# Patient Record
Sex: Male | Born: 1985 | Hispanic: Yes | Marital: Married | Smoking: Never smoker
Health system: Southern US, Community
[De-identification: ages and names within clinical notes are randomized; demographics above are authoritative.]

---

## 2014-11-30 ENCOUNTER — Emergency Department
Admission: EM | Admit: 2014-11-30 | Discharge: 2014-11-30 | Disposition: A | Discharge status: Home / Self Care | Payer: Self-pay | Attending: Emergency Medicine | Admitting: Emergency Medicine

## 2014-11-30 ENCOUNTER — Emergency Department: Payer: Self-pay

## 2014-11-30 ENCOUNTER — Encounter: Payer: Self-pay | Admitting: *Deleted

## 2014-11-30 DIAGNOSIS — S5001XA Contusion of right elbow, initial encounter: Secondary | ICD-10-CM | POA: Insufficient documentation

## 2014-11-30 DIAGNOSIS — S29001A Unspecified injury of muscle and tendon of front wall of thorax, initial encounter: Secondary | ICD-10-CM | POA: Insufficient documentation

## 2014-11-30 DIAGNOSIS — Y92410 Unspecified street and highway as the place of occurrence of the external cause: Secondary | ICD-10-CM | POA: Insufficient documentation

## 2014-11-30 DIAGNOSIS — Y998 Other external cause status: Secondary | ICD-10-CM | POA: Insufficient documentation

## 2014-11-30 DIAGNOSIS — S20211A Contusion of right front wall of thorax, initial encounter: Secondary | ICD-10-CM | POA: Insufficient documentation

## 2014-11-30 DIAGNOSIS — Z23 Encounter for immunization: Secondary | ICD-10-CM | POA: Insufficient documentation

## 2014-11-30 DIAGNOSIS — Y9389 Activity, other specified: Secondary | ICD-10-CM | POA: Insufficient documentation

## 2014-11-30 DIAGNOSIS — S50311A Abrasion of right elbow, initial encounter: Secondary | ICD-10-CM | POA: Insufficient documentation

## 2014-11-30 MED ORDER — TETANUS-DIPHTH-ACELL PERTUSSIS 5-2.5-18.5 LF-MCG/0.5 IM SUSP
0.5000 mL | Freq: Once | INTRAMUSCULAR | Status: AC
  Administered 2014-11-30: 0.5 mL via INTRAMUSCULAR
  Filled 2014-11-30: qty 0.5

## 2014-11-30 MED ORDER — BACITRACIN ZINC 500 UNIT/GM EX OINT
TOPICAL_OINTMENT | Freq: Once | CUTANEOUS | Status: AC
  Administered 2014-11-30: 1 via TOPICAL
  Filled 2014-11-30: qty 0.9
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
